# Patient Record
Sex: Male | Born: 1980 | Race: Black or African American | Hispanic: No | Marital: Married | Smoking: Never smoker
Health system: Southern US, Community
[De-identification: ages and names within clinical notes are randomized; demographics above are authoritative.]

---

## 2002-01-21 ENCOUNTER — Emergency Department (HOSPITAL_COMMUNITY): Admission: EM | Admit: 2002-01-21 | Discharge: 2002-01-21 | Payer: Self-pay | Admitting: Emergency Medicine

## 2002-02-18 ENCOUNTER — Ambulatory Visit (HOSPITAL_BASED_OUTPATIENT_CLINIC_OR_DEPARTMENT_OTHER): Admission: RE | Admit: 2002-02-18 | Discharge: 2002-02-18 | Payer: Self-pay | Admitting: *Deleted

## 2002-02-18 ENCOUNTER — Encounter (INDEPENDENT_AMBULATORY_CARE_PROVIDER_SITE_OTHER): Payer: Self-pay | Admitting: *Deleted

## 2003-04-03 ENCOUNTER — Emergency Department (HOSPITAL_COMMUNITY): Admission: EM | Admit: 2003-04-03 | Discharge: 2003-04-03 | Payer: Self-pay | Admitting: Emergency Medicine

## 2003-12-26 ENCOUNTER — Emergency Department (HOSPITAL_COMMUNITY): Admission: EM | Admit: 2003-12-26 | Discharge: 2003-12-26 | Payer: Self-pay | Admitting: Emergency Medicine

## 2005-01-01 ENCOUNTER — Emergency Department (HOSPITAL_COMMUNITY): Admission: EM | Admit: 2005-01-01 | Discharge: 2005-01-01 | Payer: Self-pay | Admitting: Emergency Medicine

## 2005-01-03 ENCOUNTER — Emergency Department (HOSPITAL_COMMUNITY): Admission: EM | Admit: 2005-01-03 | Discharge: 2005-01-03 | Payer: Self-pay | Admitting: Emergency Medicine

## 2006-04-21 ENCOUNTER — Emergency Department (HOSPITAL_COMMUNITY): Admission: EM | Admit: 2006-04-21 | Discharge: 2006-04-21 | Payer: Self-pay | Admitting: Emergency Medicine
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
## 2008-02-18 ENCOUNTER — Ambulatory Visit: Payer: Self-pay | Admitting: Internal Medicine

## 2008-02-18 DIAGNOSIS — R51 Headache: Secondary | ICD-10-CM

## 2008-02-18 DIAGNOSIS — R519 Headache, unspecified: Secondary | ICD-10-CM | POA: Insufficient documentation

## 2008-02-22 LAB — CONVERTED CEMR LAB
ALT: 11 units/L (ref 0–53)
AST: 16 units/L (ref 0–37)
Albumin: 4.8 g/dL (ref 3.5–5.2)
Alkaline Phosphatase: 62 units/L (ref 39–117)
BUN: 12 mg/dL (ref 6–23)
Basophils Absolute: 0 10*3/uL (ref 0.0–0.1)
Basophils Relative: 1 % (ref 0–1)
Bilirubin, Direct: 0.2 mg/dL (ref 0.0–0.3)
CO2: 26 meq/L (ref 19–32)
Calcium: 9.7 mg/dL (ref 8.4–10.5)
Chloride: 103 meq/L (ref 96–112)
Cholesterol: 162 mg/dL (ref 0–200)
Creatinine, Ser: 1 mg/dL (ref 0.40–1.50)
Eosinophils Absolute: 0.1 10*3/uL (ref 0.0–0.7)
Eosinophils Relative: 2 % (ref 0–5)
Glucose, Bld: 110 mg/dL — ABNORMAL HIGH (ref 70–99)
HCT: 45.2 % (ref 39.0–52.0)
Hemoglobin: 15 g/dL (ref 13.0–17.0)
Indirect Bilirubin: 0.5 mg/dL (ref 0.0–0.9)
Lymphocytes Relative: 42 % (ref 12–46)
Lymphs Abs: 2.5 10*3/uL (ref 0.7–4.0)
MCHC: 33.2 g/dL (ref 30.0–36.0)
MCV: 89.9 fL (ref 78.0–100.0)
Monocytes Absolute: 0.4 10*3/uL (ref 0.1–1.0)
Monocytes Relative: 6 % (ref 3–12)
Neutro Abs: 3 10*3/uL (ref 1.7–7.7)
Neutrophils Relative %: 49 % (ref 43–77)
Platelets: 265 10*3/uL (ref 150–400)
Potassium: 4.1 meq/L (ref 3.5–5.3)
RBC: 5.03 M/uL (ref 4.22–5.81)
RDW: 13 % (ref 11.5–15.5)
Sodium: 143 meq/L (ref 135–145)
Total Bilirubin: 0.7 mg/dL (ref 0.3–1.2)
Total Protein: 7.8 g/dL (ref 6.0–8.3)
WBC: 6 10*3/uL (ref 4.0–10.5)

## 2010-07-19 NOTE — Op Note (Signed)
Ronald Keith, Ronald Keith                          ACCOUNT NO.:  [id number]   MEDICAL RECORD NO.:  [id number]                   PATIENT TYPE:  AMB   LOCATION:  DSC                                  FACILITY:  [facility]   PHYSICIAN:  Thomas B. Samuella Cota, M.D.               DATE OF BIRTH:  [birth year]   DATE OF PROCEDURE:  02/18/2002  DATE OF DISCHARGE:                                 OPERATIVE REPORT   CCS NUMBER:  [id number].   PREOPERATIVE DIAGNOSIS:  Right inguinal hernia.   POSTOPERATIVE DIAGNOSIS:  Right inguinal hernia.   OPERATION:  Right inguinal herniorrhaphy with mesh.   SURGEON:  Maisie Fus B. Samuella Cota, M.D.   ANESTHESIA:  Local (0.25% Marcaine without epinephrine, 1% Xylocaine without  epinephrine, and sodium bicarbonate) with anesthesia monitoring.   ANESTHESIOLOGIST:  Janetta Hora. Gelene Mink, M.D., and CRNA Daphine Deutscher.   PROCEDURE:  The patient was taken to the operating room and placed on the  table in the supine position after the right lower quadrant of the abdomen  had been prepped and sterile drapes applied.  A black incision was outlined  with a skin marker.  The local was used to block the ilioinguinal nerve and  the area for the incision.  Once the ilioinguinal nerve was exposed, it was  blocked directly.  The incision was taken through the skin and subcutaneous  tissues with subcutaneous bleeders being cauterized with the Bovie or  ligated with 3-0 Vicryl.  The patient had a minimal amount of fatty tissue.  The external oblique aponeurosis was divided in line with its fibers to and  through the external ring.  The ilioinguinal nerve was seen and preserved.  The cord structures were isolated with a Penrose drain.  The patient was  found to have a fairly large, indirect hernia sac coming off the  anteromedial aspect of the cord structures.  It seemed quite large at its  base, so the hernia sac was opened, and there was no content of the hernia  sac.  There was a considerable amount of  fatty tissue on the hernia sac  medially.  The excess peritoneum was excised, and then the peritoneum was  closed with a running suture of 0 chromic catgut with the two ends tied to  each other.  This was allowed to retract beneath the transversus muscle.  There was really not much dilatation of the internal ring.  The patient's  inguinal floor appeared in pretty good shape.  A piece of 3 inch x 6 inch  atrium mesh was then fashioned to cover the inguinal floor and extended  around the cord structures superior and lateral to the internal ring.  The  mesh was anchored inferiorly with a running stitch of 2-0 Novofil with the  first suture being placed in the pubic tubercle and the other sutures in the  shelving edge of the Poupart's ligament.  The mesh was  anchored superiorly  and medially with interrupted sutures of 0 Novofil.  A slit was made into  the mesh to accommodate the cord and the internal ring, and the mesh was  then sutured to itself superior and lateral to the internal ring using a 3-0  Vicryl suture.  The cord structures and the ilioinguinal nerve were then  returned to their normal anatomical position. The ilioinguinal nerve was  coming out of the internal ring.  The external oblique aponeurosis was  reapproximated with interrupted sutures of 3-0 Vicryl.  The Scarpa's fascia  was closed with 3-0  Vicryl, and the skin was closed with a running subcuticular suture of 4-0  Monocryl. Benzoin and 0.5-inch Steri-Strips were used to reinforce the skin  closure.  A dry, sterile dressing was applied.  The patient seemed to  tolerate the procedure well and was taken to the PACU in satisfactory  condition.                                               Thomas B. Samuella Cota, M.D.    TBP/MEDQ  D:  02/18/2002  T:  02/19/2002  Job:  [id number]

## 2015-09-01 ENCOUNTER — Encounter (HOSPITAL_COMMUNITY): Payer: Self-pay

## 2015-09-01 ENCOUNTER — Emergency Department (HOSPITAL_COMMUNITY)
Admission: EM | Admit: 2015-09-01 | Discharge: 2015-09-01 | Disposition: A | Discharge status: Home / Self Care | Payer: 59 | Attending: Pediatric Emergency Medicine | Admitting: Pediatric Emergency Medicine

## 2015-09-01 ENCOUNTER — Emergency Department (HOSPITAL_COMMUNITY): Payer: 59

## 2015-09-01 DIAGNOSIS — S161XXA Strain of muscle, fascia and tendon at neck level, initial encounter: Secondary | ICD-10-CM | POA: Diagnosis not present

## 2015-09-01 DIAGNOSIS — Y939 Activity, unspecified: Secondary | ICD-10-CM | POA: Insufficient documentation

## 2015-09-01 DIAGNOSIS — S199XXA Unspecified injury of neck, initial encounter: Secondary | ICD-10-CM | POA: Diagnosis present

## 2015-09-01 DIAGNOSIS — Y92410 Unspecified street and highway as the place of occurrence of the external cause: Secondary | ICD-10-CM | POA: Diagnosis not present

## 2015-09-01 DIAGNOSIS — Y999 Unspecified external cause status: Secondary | ICD-10-CM | POA: Insufficient documentation

## 2015-09-01 MED ORDER — IBUPROFEN 400 MG PO TABS
600.0000 mg | ORAL_TABLET | Freq: Once | ORAL | Status: AC
  Administered 2015-09-01: 600 mg via ORAL
  Filled 2015-09-01: qty 1
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
## 2015-09-01 NOTE — Discharge Instructions (Signed)
Cervical Sprain A cervical sprain is when the tissues (ligaments) that hold the neck bones in place stretch or tear. HOME CARE   Put ice on the injured area.  Put ice in a plastic bag.  Place a towel between your skin and the bag.  Leave the ice on for 15-20 minutes, 3-4 times a day.  You may have been given a collar to wear. This collar keeps your neck from moving while you heal.  Do not take the collar off unless told by your doctor.  If you have long hair, keep it outside of the collar.  Ask your doctor before changing the position of your collar. You may need to change its position over time to make it more comfortable.  If you are allowed to take off the collar for cleaning or bathing, follow your doctor's instructions on how to do it safely.  Keep your collar clean by wiping it with mild soap and water. Dry it completely. If the collar has removable pads, remove them every 1-2 days to hand wash them with soap and water. Allow them to air dry. They should be dry before you wear them in the collar.  Do not drive while wearing the collar.  Only take medicine as told by your doctor.  Keep all doctor visits as told.  Keep all physical therapy visits as told.  Adjust your work station so that you have good posture while you work.  Avoid positions and activities that make your problems worse.  Warm up and stretch before being active. GET HELP IF:  Your pain is not controlled with medicine.  You cannot take less pain medicine over time as planned.  Your activity level does not improve as expected. GET HELP RIGHT AWAY IF:   You are bleeding.  Your stomach is upset.  You have an allergic reaction to your medicine.  You develop new problems that you cannot explain.  You lose feeling (become numb) or you cannot move any part of your body (paralysis).  You have tingling or weakness in any part of your body.  Your symptoms get worse. Symptoms include:  Pain,  soreness, stiffness, puffiness (swelling), or a burning feeling in your neck.  Pain when your neck is touched.  Shoulder or upper back pain.  Limited ability to move your neck.  Headache.  Dizziness.  Your hands or arms feel week, lose feeling, or tingle.  Muscle spasms.  Difficulty swallowing or chewing. MAKE SURE YOU:   Understand these instructions.  Will watch your condition.  Will get help right away if you are not doing well or get worse.   This information is not intended to replace advice given to you by your health care provider. Make sure you discuss any questions you have with your health care provider.   Document Released: 08/06/2007 Document Revised: 10/20/2012 Document Reviewed: 08/25/2012 Elsevier Interactive Patient Education 2016 [employer] Back Pain, Adult Back pain is very common in adults.The cause of back pain is rarely dangerous and the pain often gets better over time.The cause of your back pain may not be known. Some common causes of back pain include:  Strain of the muscles or ligaments supporting the spine.  Wear and tear (degeneration) of the spinal disks.  Arthritis.  Direct injury to the back. For many people, back pain may return. Since back pain is rarely dangerous, most people can learn to manage this condition on their own. HOME CARE INSTRUCTIONS Watch your back pain for  any changes. The following actions may help to lessen any discomfort you are feeling:  Remain active. It is stressful on your back to sit or stand in one place for long periods of time. Do not sit, drive, or stand in one place for more than 30 minutes at a time. Take short walks on even surfaces as soon as you are able.Try to increase the length of time you walk each day.  Exercise regularly as directed by your health care provider. Exercise helps your back heal faster. It also helps avoid future injury by keeping your muscles strong and flexible.  Do not stay in  bed.Resting more than 1-2 days can delay your recovery.  Pay attention to your body when you bend and lift. The most comfortable positions are those that put less stress on your recovering back. Always use proper lifting techniques, including:  Bending your knees.  Keeping the load close to your body.  Avoiding twisting.  Find a comfortable position to sleep. Use a firm mattress and lie on your side with your knees slightly bent. If you lie on your back, put a pillow under your knees.  Avoid feeling anxious or stressed.Stress increases muscle tension and can worsen back pain.It is important to recognize when you are anxious or stressed and learn ways to manage it, such as with exercise.  Take medicines only as directed by your health care provider. Over-the-counter medicines to reduce pain and inflammation are often the most helpful.Your health care provider may prescribe muscle relaxant drugs.These medicines help dull your pain so you can more quickly return to your normal activities and healthy exercise.  Apply ice to the injured area:  Put ice in a plastic bag.  Place a towel between your skin and the bag.  Leave the ice on for 20 minutes, 2-3 times a day for the first 2-3 days. After that, ice and heat may be alternated to reduce pain and spasms.  Maintain a healthy weight. Excess weight puts extra stress on your back and makes it difficult to maintain good posture. SEEK MEDICAL CARE IF:  You have pain that is not relieved with rest or medicine.  You have increasing pain going down into the legs or buttocks.  You have pain that does not improve in one week.  You have night pain.  You lose weight.  You have a fever or chills. SEEK IMMEDIATE MEDICAL CARE IF:   You develop new bowel or bladder control problems.  You have unusual weakness or numbness in your arms or legs.  You develop nausea or vomiting.  You develop abdominal pain.  You feel faint.   This  information is not intended to replace advice given to you by your health care provider. Make sure you discuss any questions you have with your health care provider.   Document Released: 02/17/2005 Document Revised: 03/10/2014 Document Reviewed: 06/21/2013 Elsevier Interactive Patient Education [employer]2016 Elsevier Inc[employer]
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
